# Patient Record
Sex: Male | Born: 1970 | Race: White | Hispanic: No | Marital: Single | Smoking: Current every day smoker
Health system: Southern US, Community
[De-identification: ages and names within clinical notes are randomized; demographics above are authoritative.]

## PROBLEM LIST (undated history)

## (undated) DIAGNOSIS — E079 Disorder of thyroid, unspecified: Secondary | ICD-10-CM

## (undated) DIAGNOSIS — I10 Essential (primary) hypertension: Secondary | ICD-10-CM

## (undated) HISTORY — PX: NO PAST SURGERIES: SHX2092

---

## 2001-02-20 ENCOUNTER — Emergency Department (HOSPITAL_COMMUNITY): Admission: EM | Admit: 2001-02-20 | Discharge: 2001-02-20 | Payer: Self-pay | Admitting: Emergency Medicine

## 2003-01-03 ENCOUNTER — Emergency Department (HOSPITAL_COMMUNITY): Admission: EM | Admit: 2003-01-03 | Discharge: 2003-01-04 | Payer: Self-pay | Admitting: Emergency Medicine

## 2003-01-03 ENCOUNTER — Encounter: Payer: Self-pay | Admitting: Emergency Medicine

## 2003-04-09 ENCOUNTER — Emergency Department (HOSPITAL_COMMUNITY): Admission: EM | Admit: 2003-04-09 | Discharge: 2003-04-09 | Payer: Self-pay | Admitting: Emergency Medicine

## 2003-04-09 ENCOUNTER — Encounter: Payer: Self-pay | Admitting: Emergency Medicine

## 2003-04-10 ENCOUNTER — Emergency Department (HOSPITAL_COMMUNITY): Admission: EM | Admit: 2003-04-10 | Discharge: 2003-04-10 | Payer: Self-pay | Admitting: Emergency Medicine

## 2003-04-12 ENCOUNTER — Emergency Department (HOSPITAL_COMMUNITY): Admission: EM | Admit: 2003-04-12 | Discharge: 2003-04-12 | Payer: Self-pay | Admitting: Emergency Medicine

## 2004-01-29 ENCOUNTER — Emergency Department (HOSPITAL_COMMUNITY): Admission: EM | Admit: 2004-01-29 | Discharge: 2004-01-29 | Payer: Self-pay | Admitting: *Deleted

## 2005-02-11 ENCOUNTER — Emergency Department: Payer: Self-pay | Admitting: Emergency Medicine

## 2006-03-25 ENCOUNTER — Emergency Department: Payer: Self-pay | Admitting: Emergency Medicine

## 2006-04-18 IMAGING — CR DG CHEST 2V
1 series · 2 of 2 positions shown · non-contrast
Comparison: none

REASON FOR EXAM: Chest pain. Rm 14
COMMENTS:

PROCEDURE:     DXR - DXR CHEST PA (OR AP) AND LATERAL  - February 11, 2005  [DATE]
RESULT:     PA and lateral views of the chest show the lungs to be clear and
well expanded without evidence of infiltrates, effusions or mass.

[Series 1: view not recorded · 0.17mm/px · 2 of 2 slices shown]
[im 1/2]
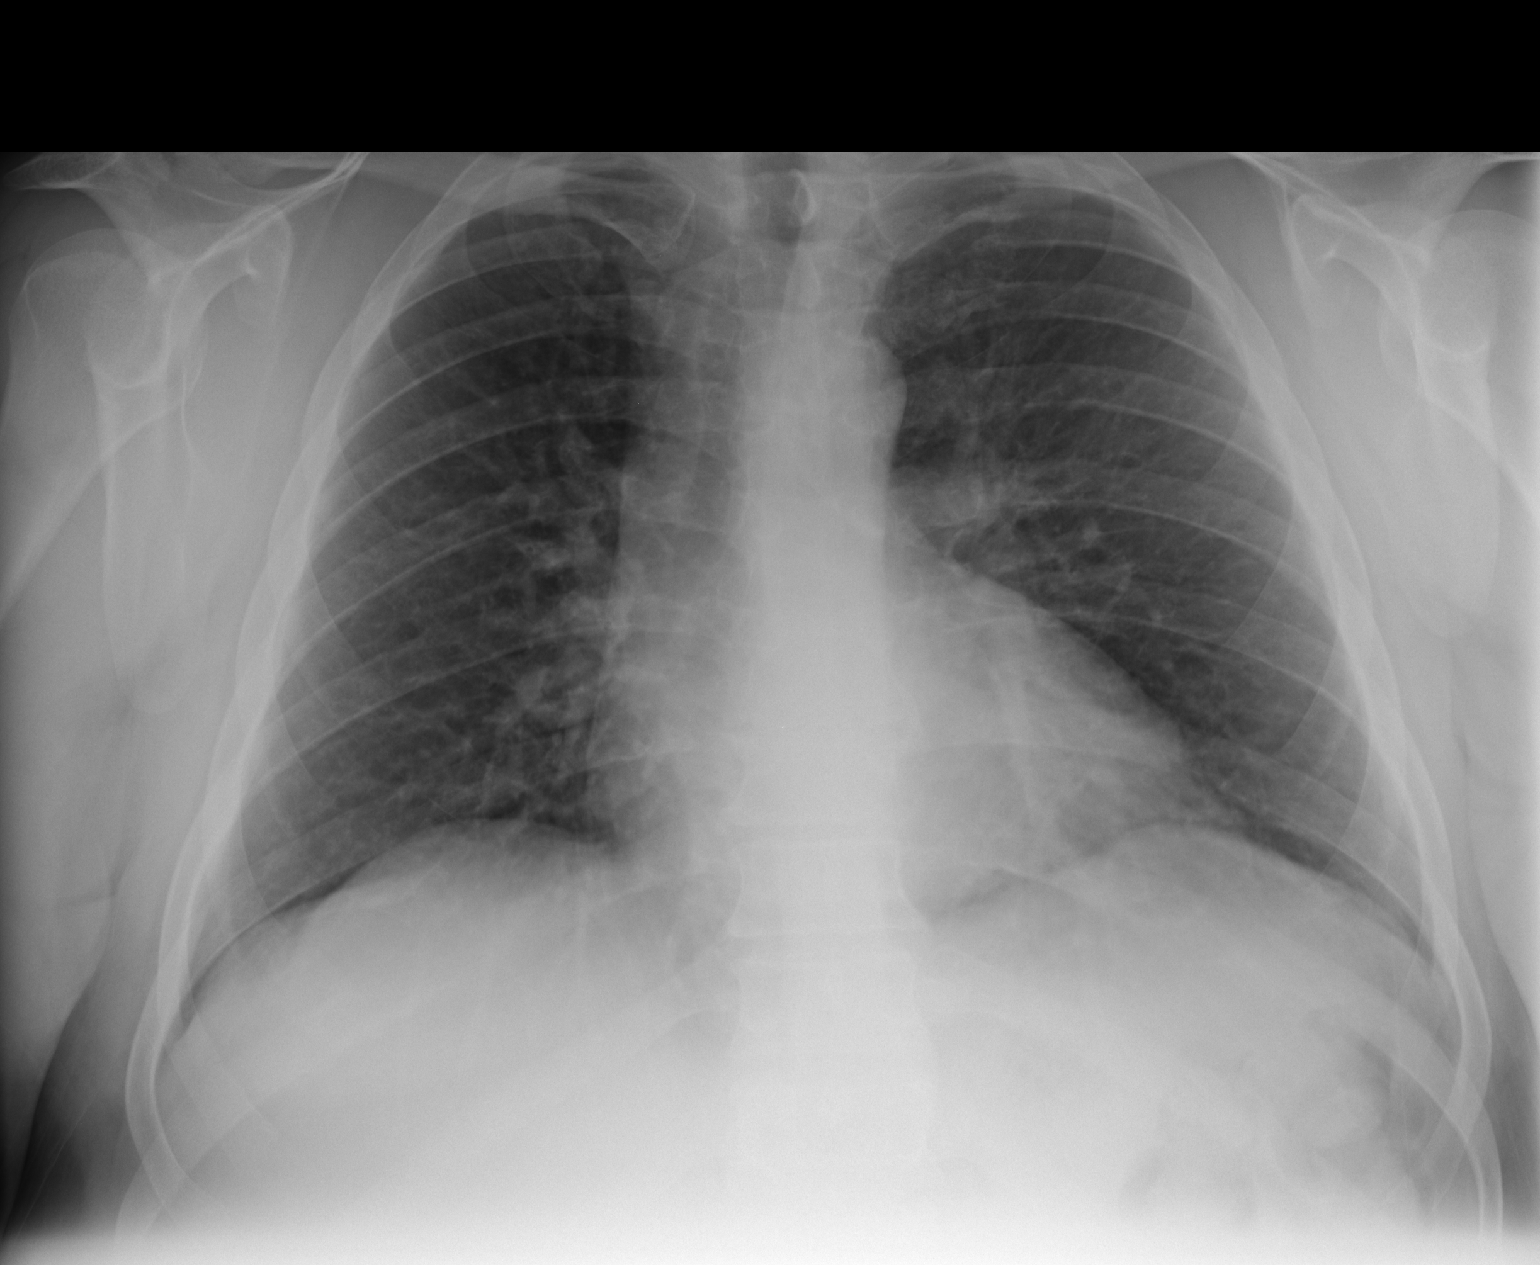
[im 2/2]
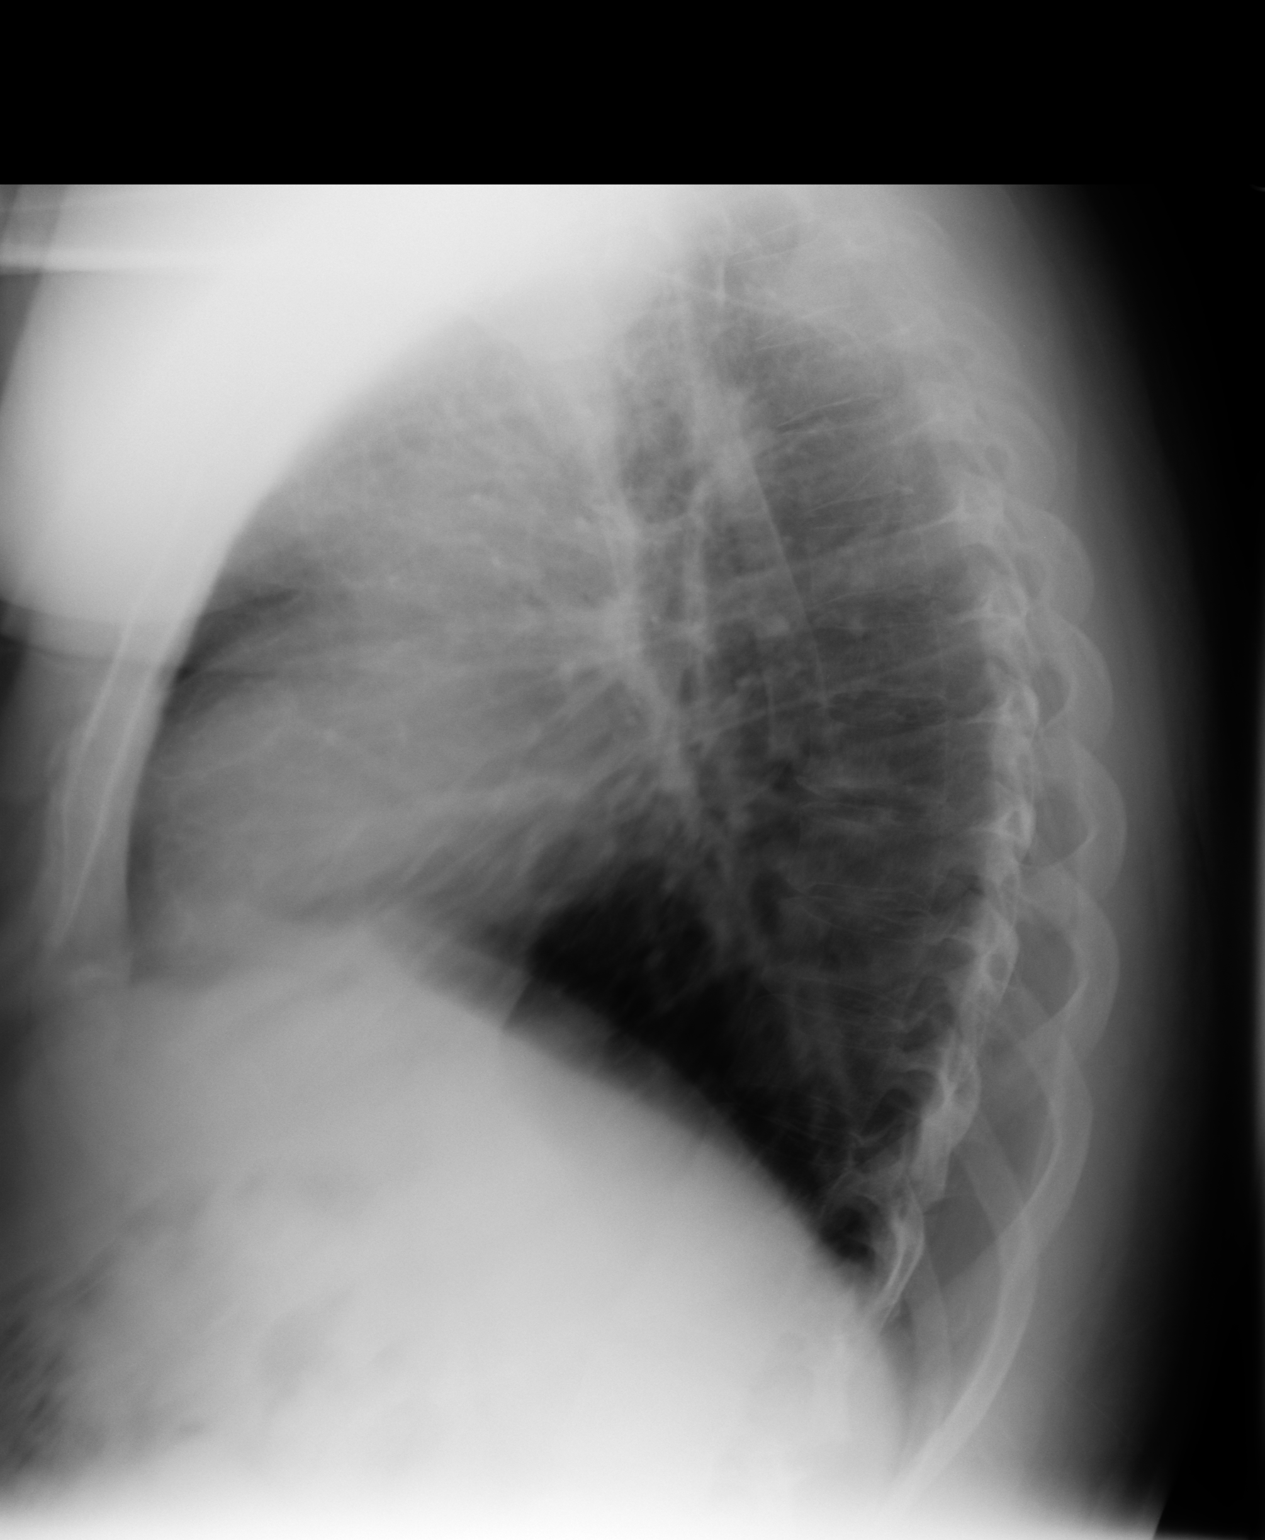

[2 of 2 positions shown; findings below may reference images not displayed]

IMPRESSION: No acute pulmonary disease.

## 2006-04-18 IMAGING — CR DG SHOULDER 3+V*R*
1 series · 3 of 3 positions shown · non-contrast
Comparison: none

REASON FOR EXAM: pain/accident/rm 14
COMMENTS:

[Series 1: view not recorded · 0.17mm/px · 3 of 3 slices shown]
[im 1/3]
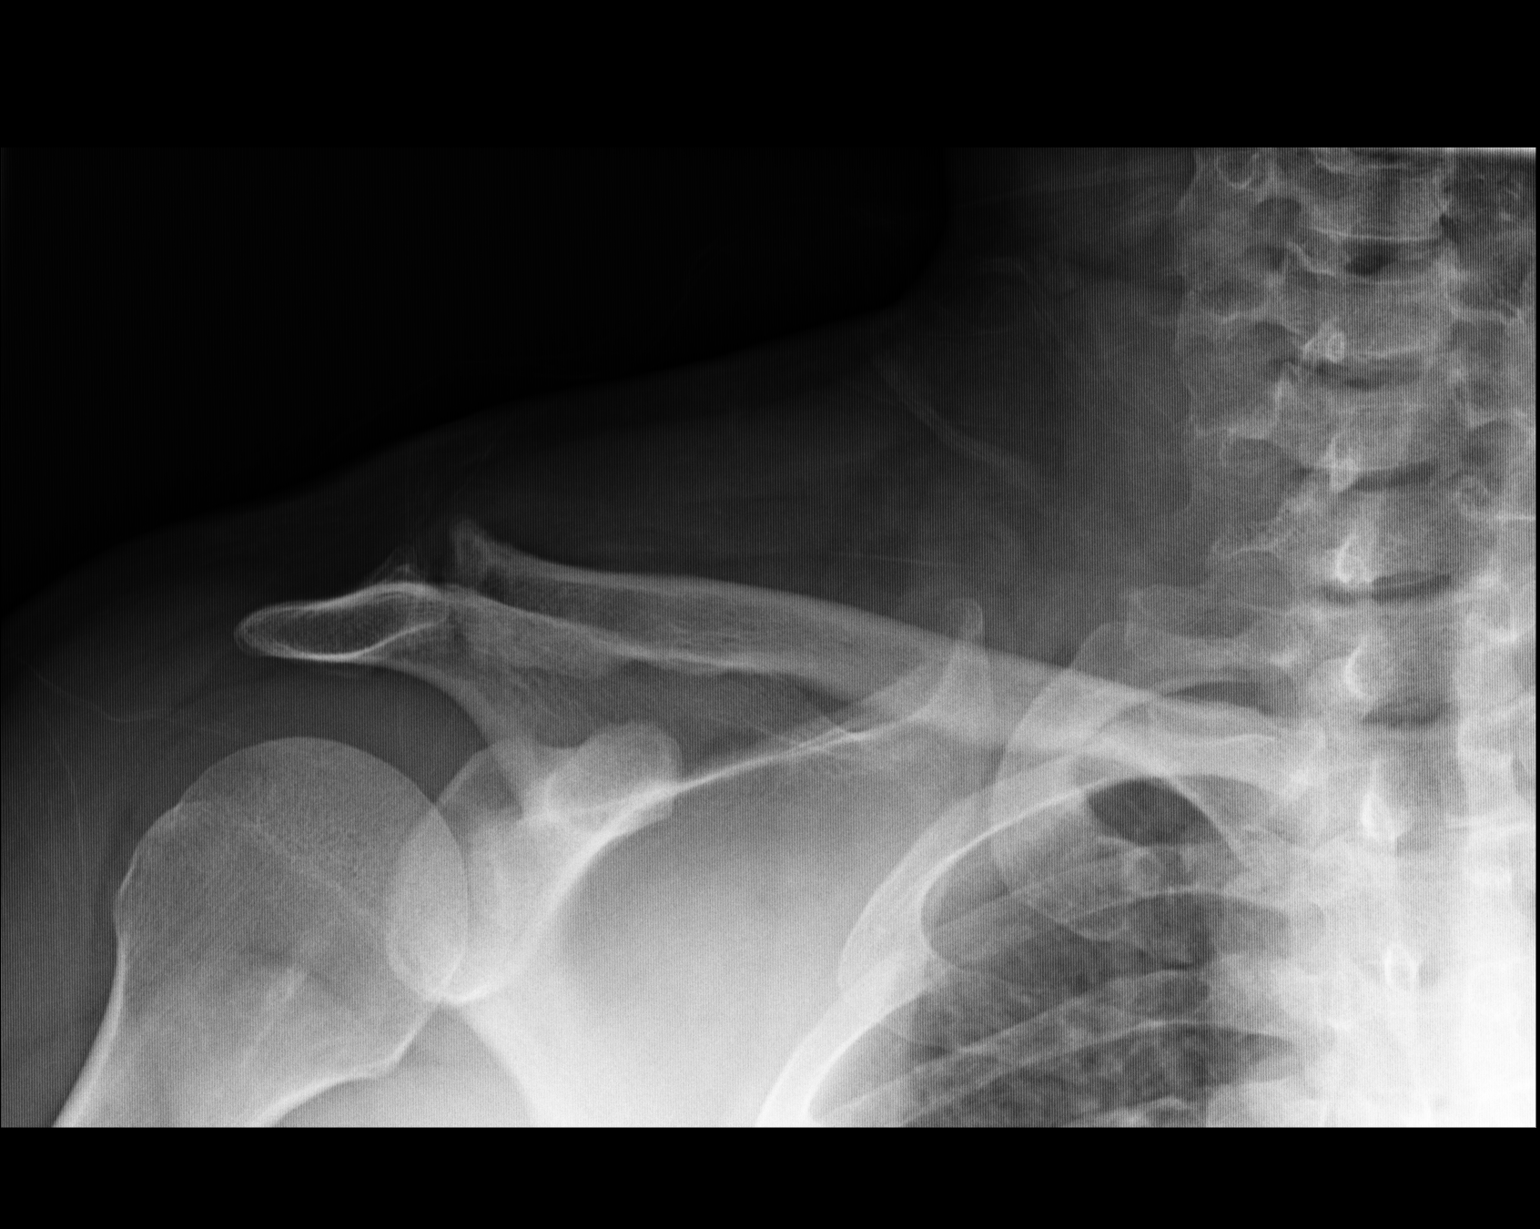
[im 2/3]
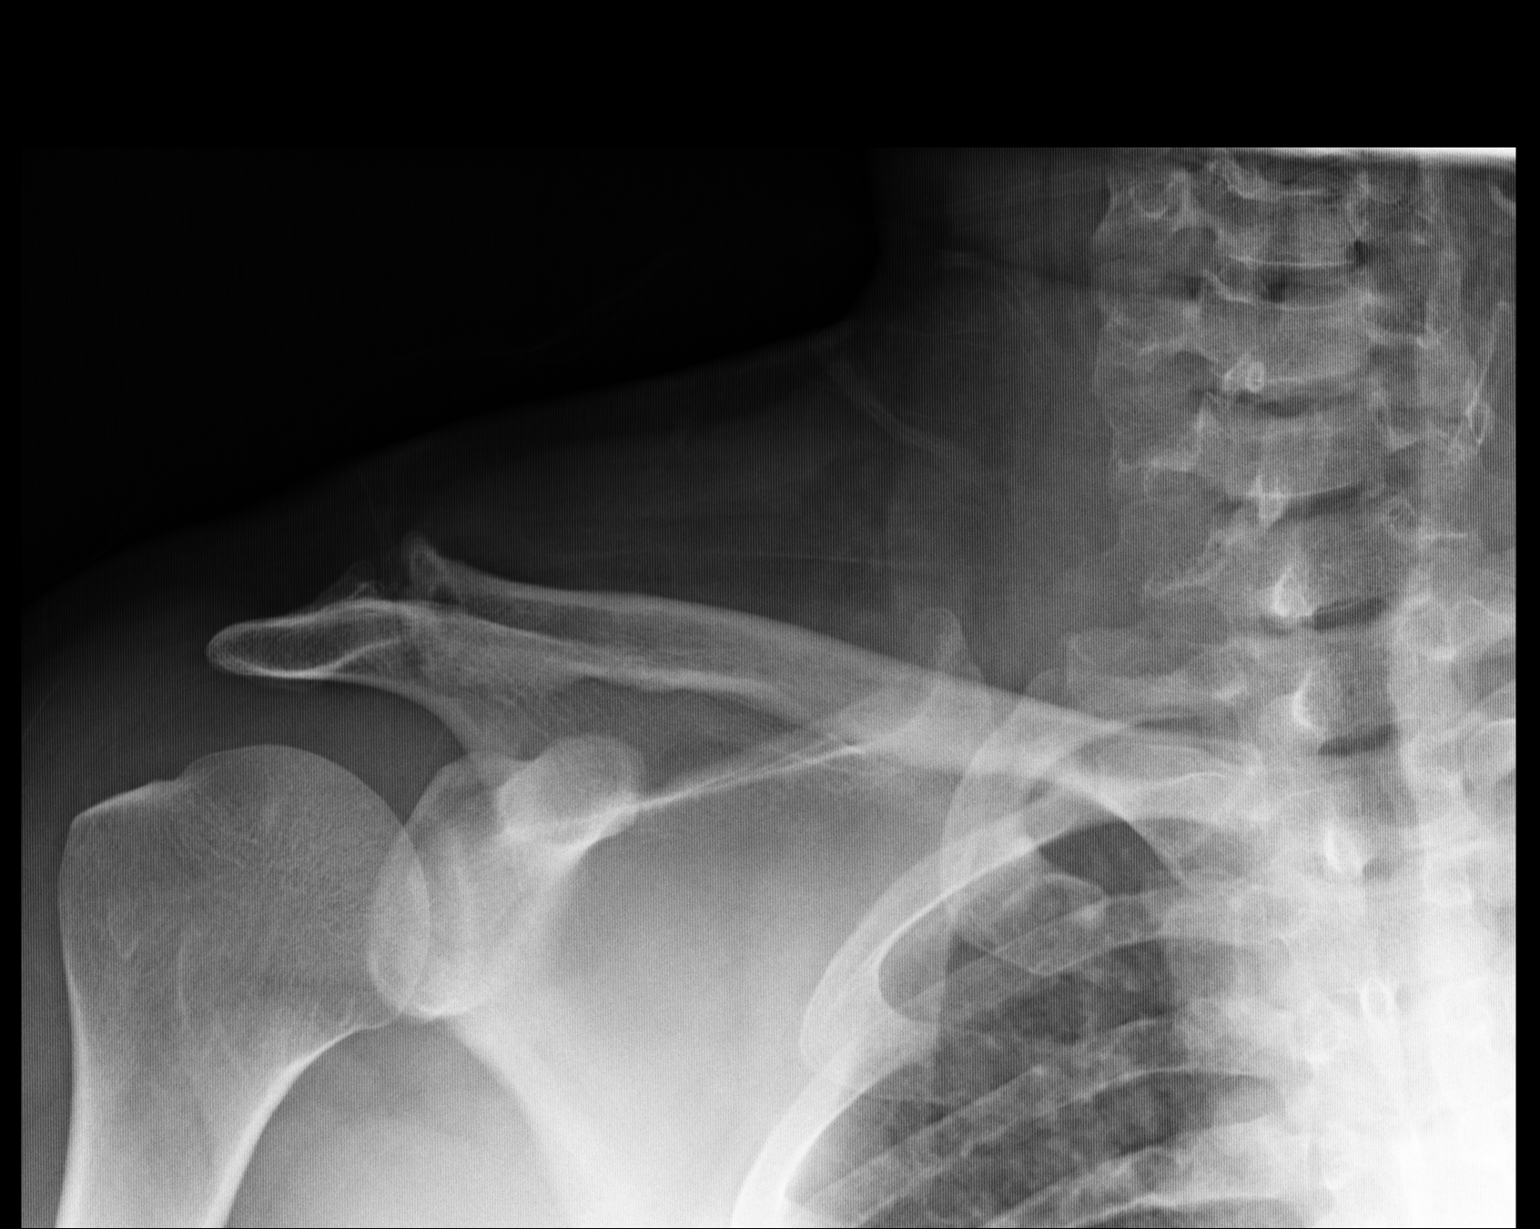
[im 3/3]
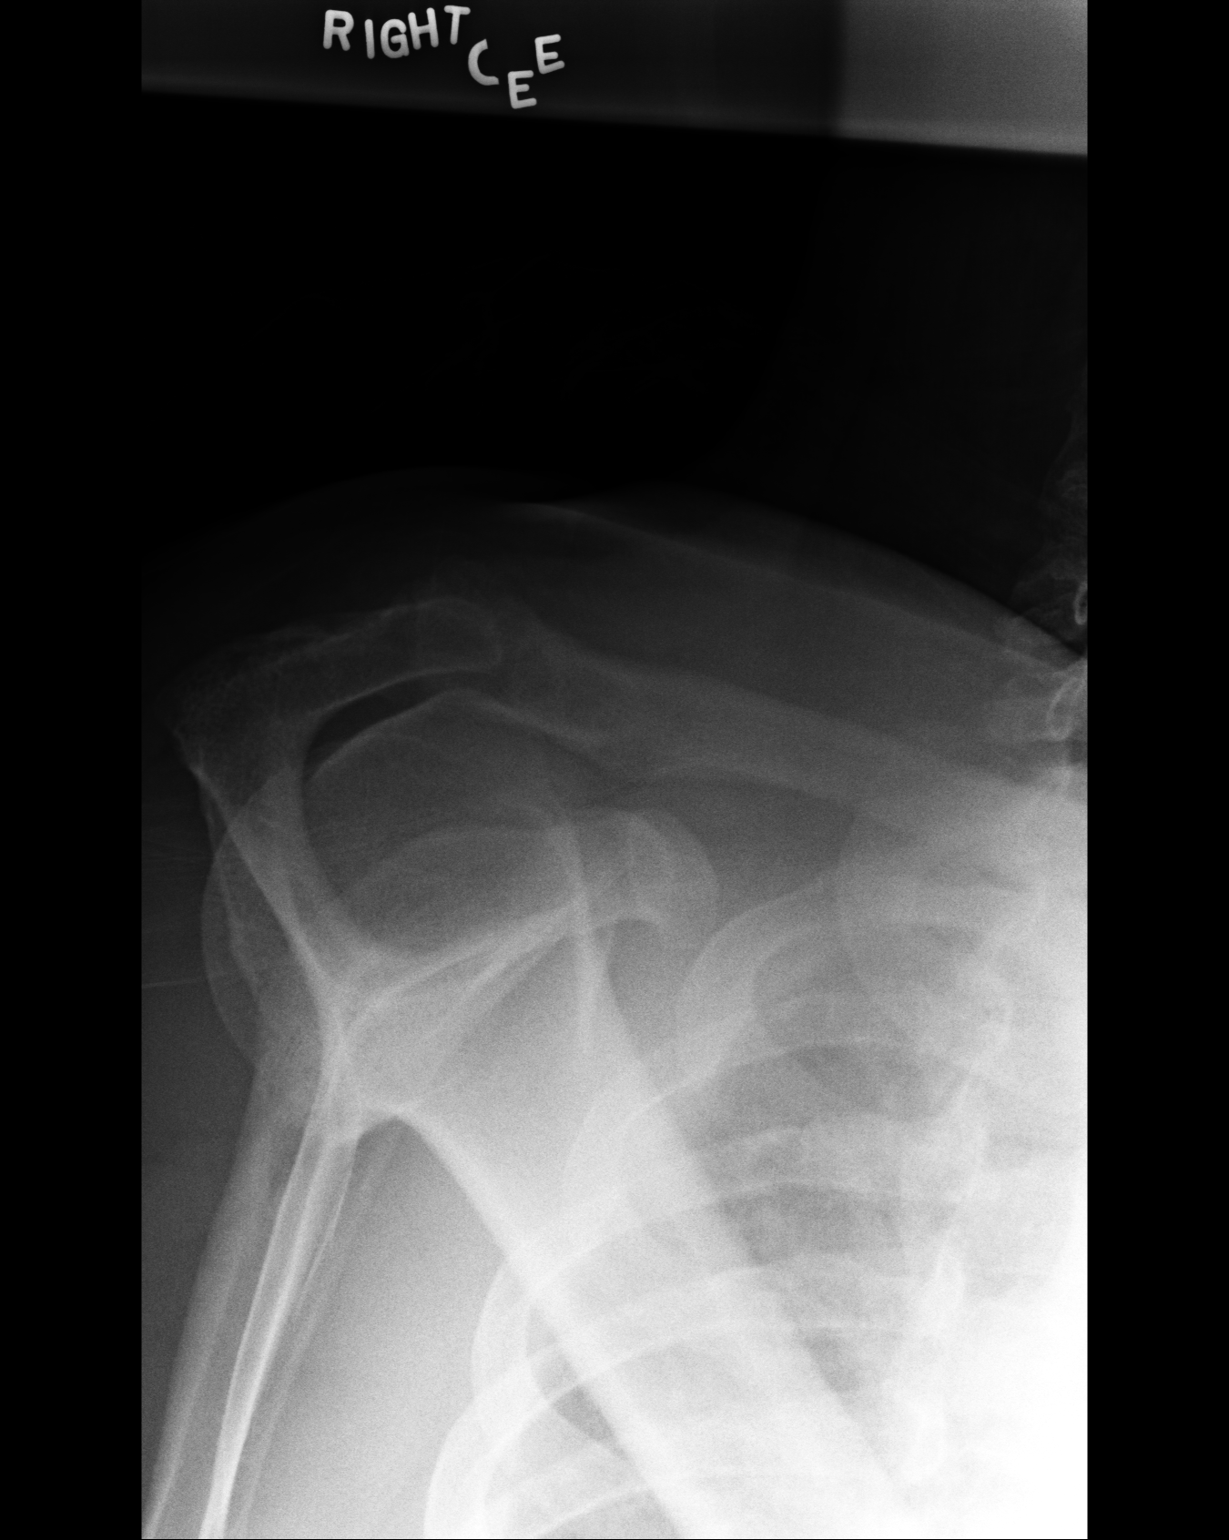

[3 of 3 positions shown; findings below may reference images not displayed]

PROCEDURE:     DXR - DXR SHOULDER RIGHT COMPLETE  - February 11, 2005  [DATE]

RESULT:     Multiple views of the RIGHT shoulder show arthritic narrowing of
the acromioclavicular joint with a prominent spur on the undersurface of the
distal clavicle. This could be impinging upon the supraspinatus tendon. The
glenohumeral joint is well preserved without evidence of fracture,
dislocation, or subluxation. There is no underlying rib fracture or
pneumothorax.
IMPRESSION: 1)Arthritic narrowing of the acromioclavicular joint without evidence of
acute traumatic abnormality.

## 2006-04-18 IMAGING — CR DG LUMBAR SPINE 2-3V
1 series · 3 of 3 positions shown · non-contrast
Comparison: none

REASON FOR EXAM: pain/accident/rm 14
COMMENTS:

[Series 1: view not recorded · 0.17mm/px · 3 of 3 slices shown]
[im 1/3]
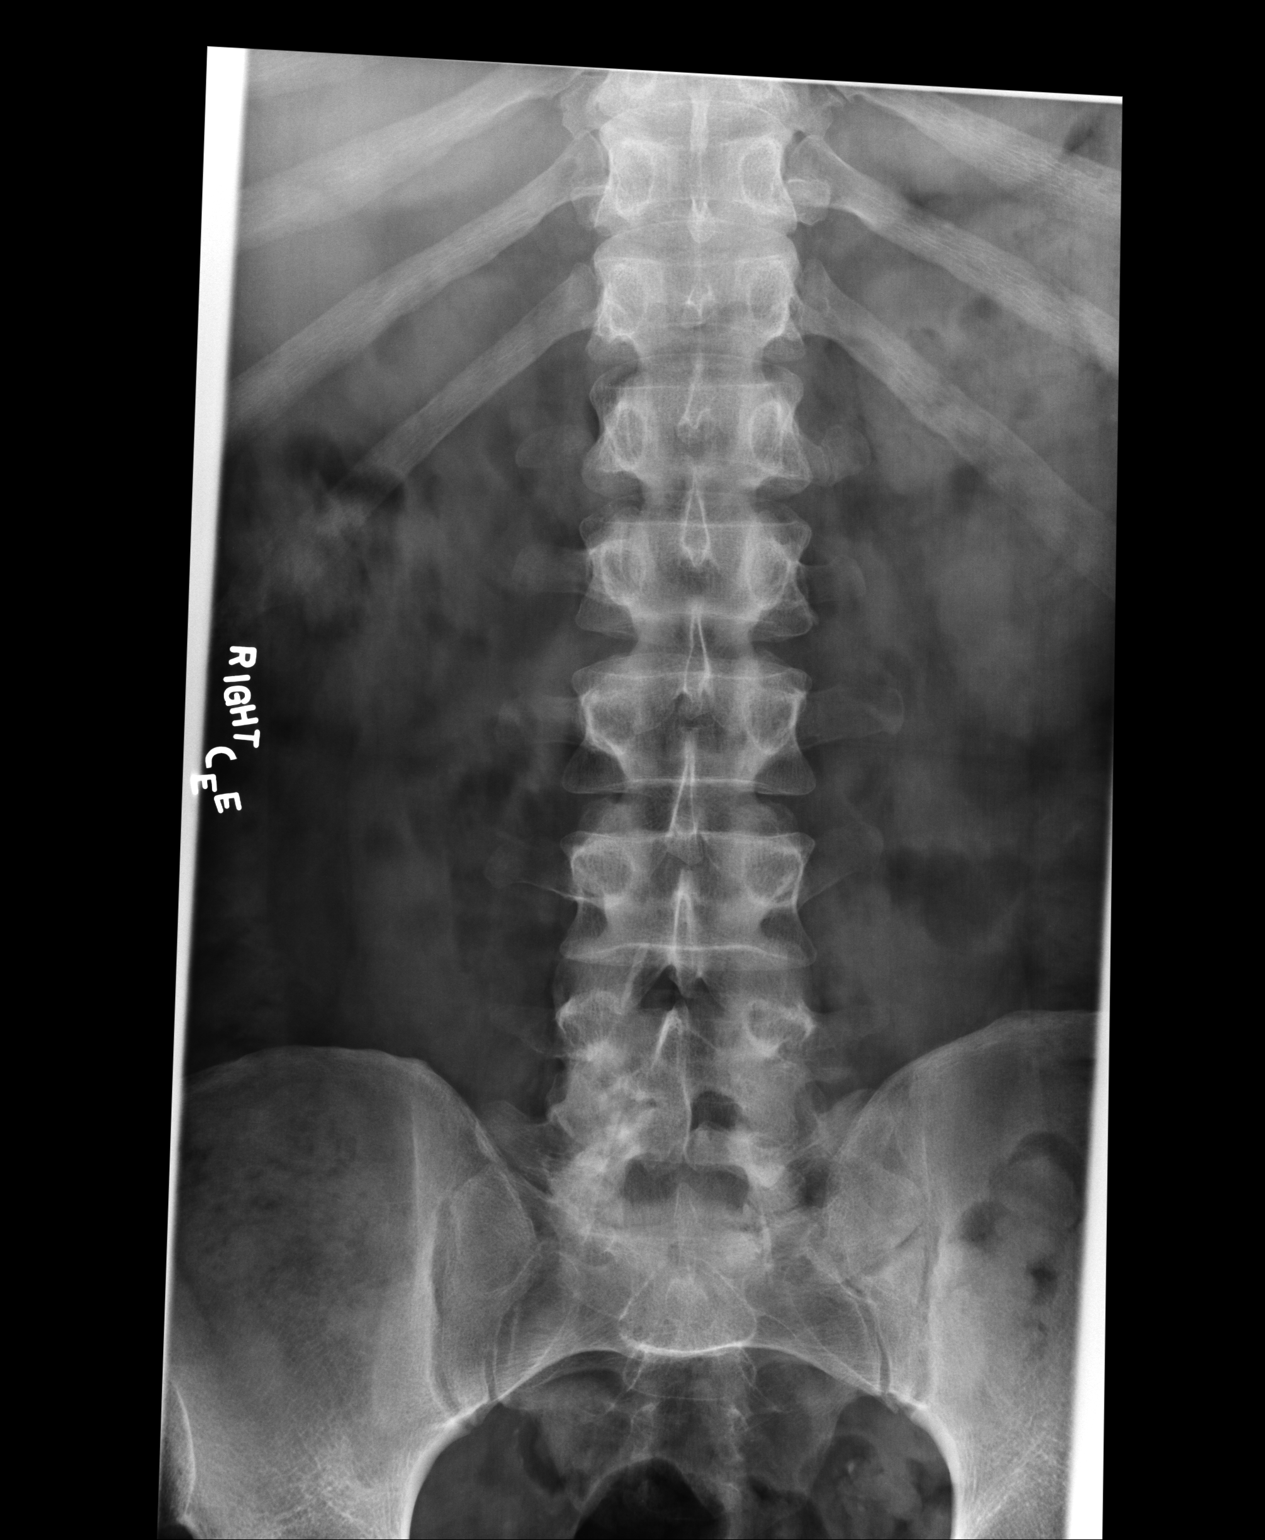
[im 2/3]
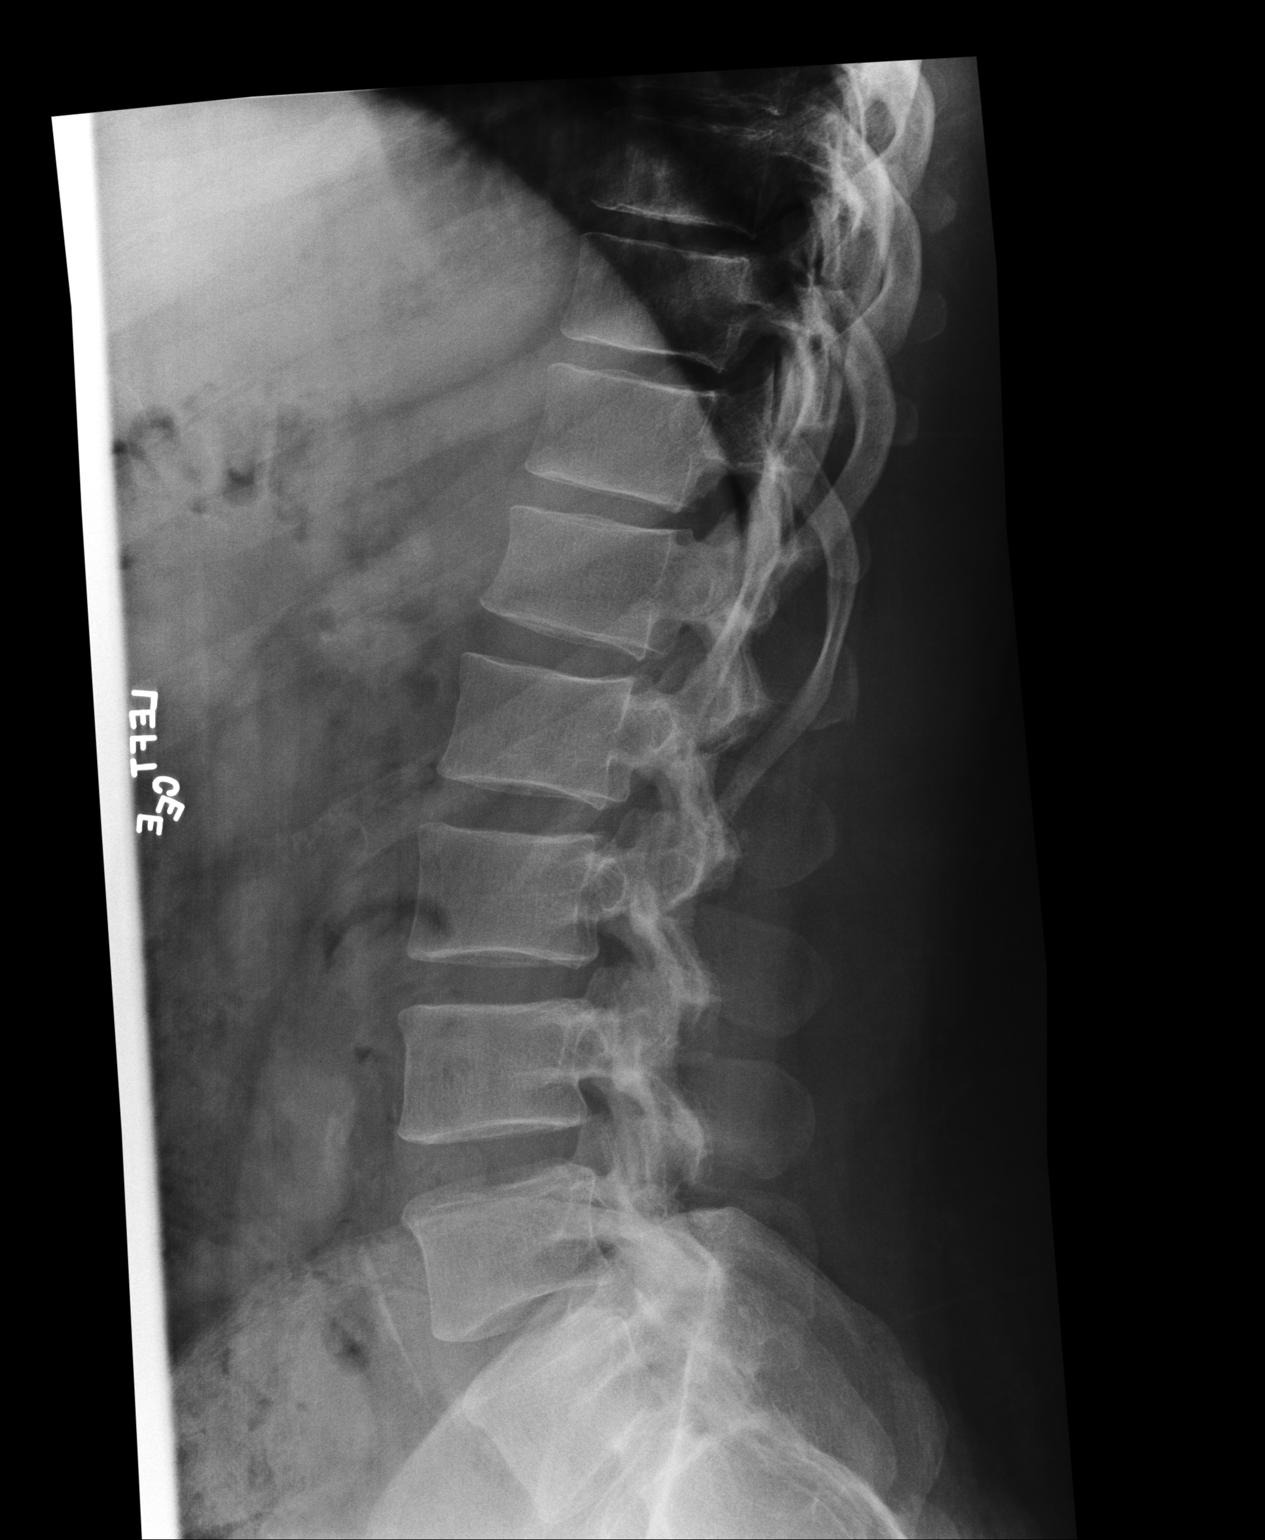
[im 3/3]
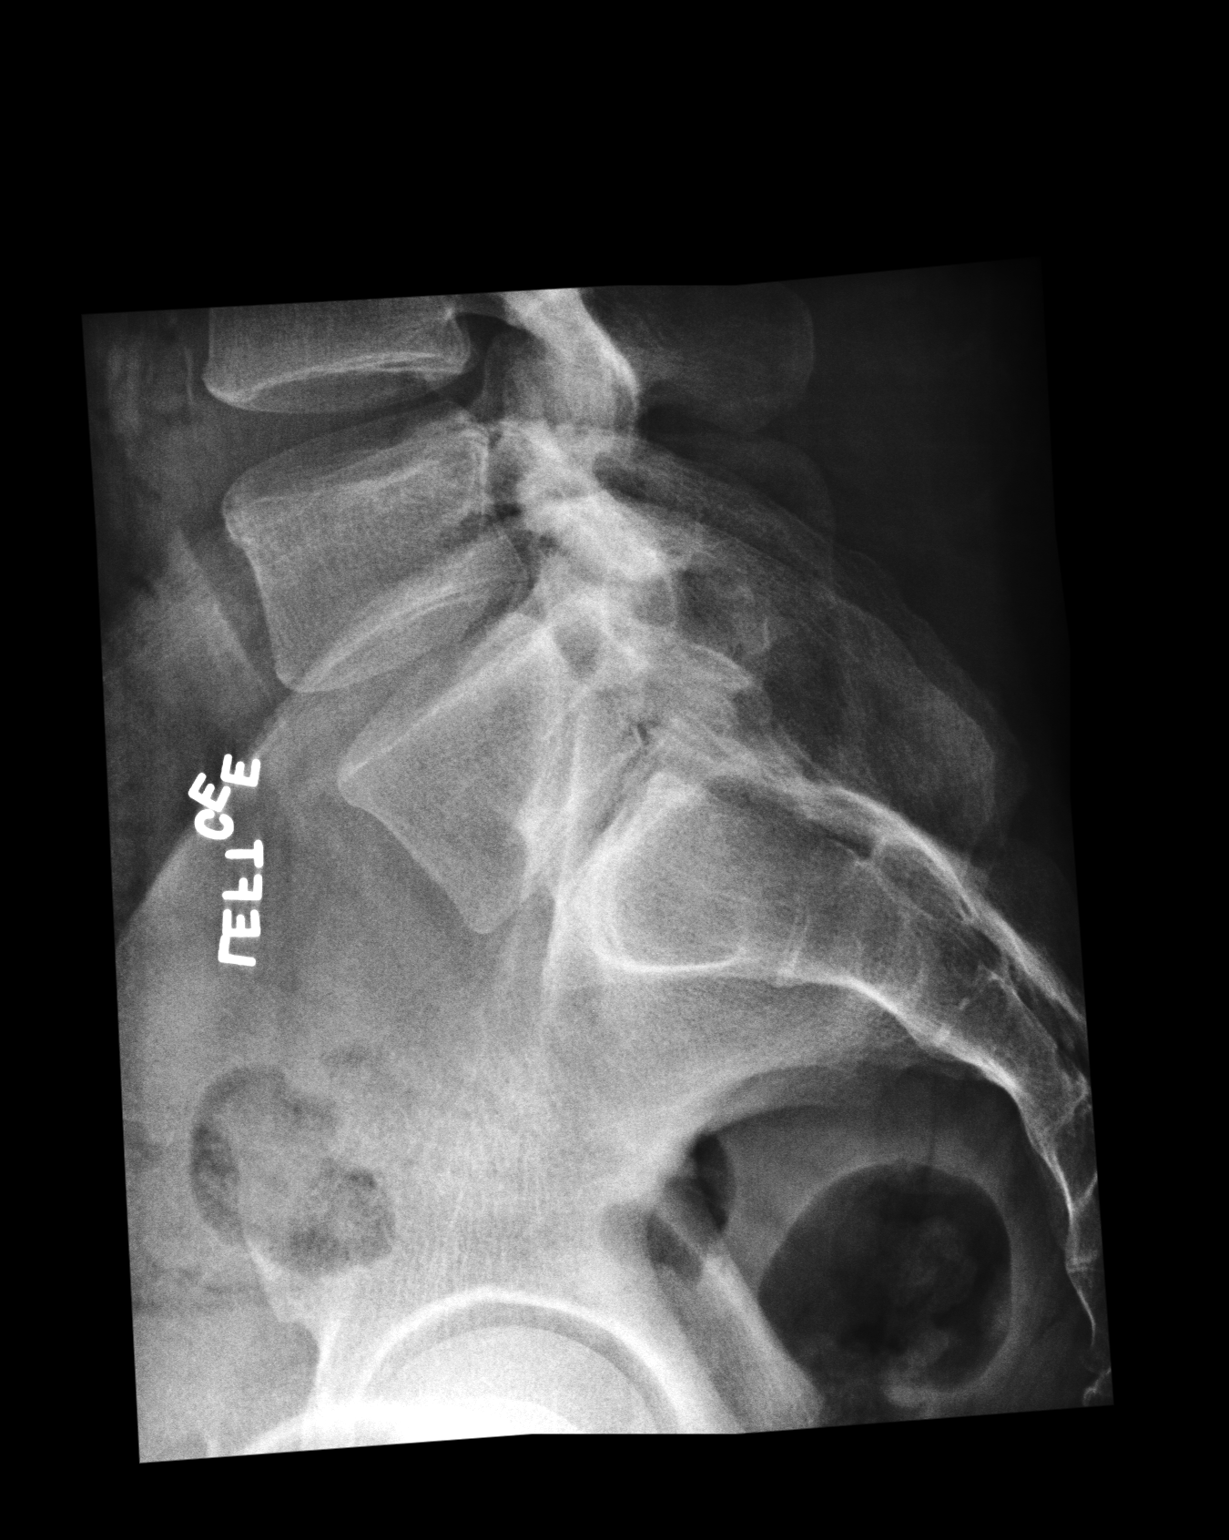

[3 of 3 positions shown; findings below may reference images not displayed]

PROCEDURE:     DXR - DXR LUMBAR SPINE AP AND LATERAL  - February 11, 2005  [DATE]

RESULT:        The lumbar spine aligns anatomically without evidence of
fracture, pars defect or spondylolisthesis.  The intervertebral disc spaces
and vertebral body heights are well maintained. There is no suspicious
paraspinal mass.
IMPRESSION: Normal lumbar spine.

## 2008-02-02 ENCOUNTER — Emergency Department: Payer: Self-pay | Admitting: Emergency Medicine

## 2008-04-14 ENCOUNTER — Encounter
Admission: RE | Admit: 2008-04-14 | Discharge: 2008-07-13 | Payer: Self-pay | Admitting: Physical Medicine & Rehabilitation

## 2008-04-17 ENCOUNTER — Ambulatory Visit: Payer: Self-pay | Admitting: Physical Medicine & Rehabilitation

## 2008-05-29 ENCOUNTER — Ambulatory Visit: Payer: Self-pay | Admitting: Physical Medicine & Rehabilitation

## 2008-07-10 ENCOUNTER — Encounter
Admission: RE | Admit: 2008-07-10 | Discharge: 2008-10-08 | Payer: Self-pay | Admitting: Physical Medicine & Rehabilitation

## 2008-07-12 ENCOUNTER — Ambulatory Visit: Payer: Self-pay | Admitting: Physical Medicine & Rehabilitation

## 2008-09-06 ENCOUNTER — Ambulatory Visit: Payer: Self-pay | Admitting: Physical Medicine & Rehabilitation

## 2008-11-28 ENCOUNTER — Emergency Department: Payer: Self-pay | Admitting: Internal Medicine

## 2008-12-29 ENCOUNTER — Encounter: Admission: RE | Admit: 2008-12-29 | Discharge: 2009-01-16 | Payer: Self-pay | Admitting: Anesthesiology

## 2009-01-02 ENCOUNTER — Ambulatory Visit: Payer: Self-pay | Admitting: Anesthesiology

## 2009-01-12 ENCOUNTER — Ambulatory Visit: Payer: Self-pay | Admitting: Anesthesiology

## 2009-01-16 ENCOUNTER — Ambulatory Visit: Payer: Self-pay | Admitting: Anesthesiology
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
## 2011-03-25 NOTE — Assessment & Plan Note (Signed)
Wayne Heath returns to clinic today for followup evaluation.  He has  been using his oxycodone 15 mg generally 4 times per day.  He reports  that he gets relief for approximately 1-2 hours with each dose.  He  tries to do chores around his home in addition to caring for his son.  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  He is  requesting a pain medicine that he could use and that is slow release,  that would be available to him throughout the day instead of just using  pills.  He has not used any OxyContin or fentanyl patch in the past.  He  reports that his primary care physician is adjusting his blood pressure  medicines.  He also requests a refill on his alprazolam in the office  today.   The patient also reports stiffness of his hands that occurs through the  night.  He reportedly had nerve conduction studies done in the past  which showed carpal tunnel bilaterally.  He reports that he resisted any  surgery and then had repeat studies that showed improvement.  He has not  had any surgery for carpal tunnel syndrome and has no splints that he  uses on a regular basis.   REVIEW OF SYSTEMS:  Noncontributory.   MEDICATIONS:  1. Oxycodone 15 mg one tablet q.i.d. p.r.n.  2. Alprazolam 2 mg one tablet t.i.d. p.r.n.  3. Nexium 40 mg daily.  4. Lipitor 40 mg daily.  5. Pain cream on the hands p.r.n.  6. Blood pressure medicine daily.   PHYSICAL EXAMINATION:  GENERAL:  A well-appearing, large, well-nourished  adult male.  VITAL SIGNS:  Height 5 feet and 11 inches, weight 270 pounds, blood  pressure is 168/96 with a pulse of 70, respiratory rate 18, and O2  saturation 98% on room air.  EXTREMITIES:  He has 5/5 strength throughout the bilateral upper and  lower extremities.  Sensation is intact to light touch throughout.   IMPRESSION:  1. Diffuse cervical and lumbar pain with minimal degenerative findings      on plain films/MRI scan.  2. Chronic arthritic  pain in the bilateral hands and arms.  3. Status post work injury with crowbar contusion to the chest.   In the office today, we did add a fentanyl patch at 25 mcg/hr to be  changed every 72 hours to his pain regimen.  He will continue to use his  oxycodone immediate release 15 mg up to 4 times per day but hopefully he  will be able to decrease that usage with a fentanyl patch being added.  We have also refilled his alprazolam in the office today.  We have  referred him for outpatient occupational therapy for resting splints to  be used at night and removed in the morning.  We will plan on seeing the  patient in followup in approximately 2 months' time with refills prior  to that appointment if necessary.           ______________________________  Ellwood Dense, M.D.     DC/MedQ  D:  07/12/2008 10:32:34  T:  07/13/2008 00:32:55  Job #:  [id number]
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
## 2011-03-25 NOTE — Assessment & Plan Note (Signed)
Mr. Lefferts returns to the clinic today for followup evaluation.  He has  been using his pain medicines and getting fair relief, although he still  reports that he would prefer better relief.  He has been using oxycodone  15 mg 4 times per day for a total of 60 mg daily.  He had been up to 150  mg in the past prior to starting treatments with this office.  He is  using his Fentanyl patch 25 mcg/hour change q.72 h.  He is also  maintained on Nexium along with Lipitor and lisinopril.  He would like  an adjustment of his medicines if at all possible.   REVIEW OF SYSTEMS:  Noncontributory.   MEDICATIONS:  1. Oxycodone 15 mg 1 tablet q.i.d. p.r.n.  2. Alprazolam 2 mg t.i.d. p.r.n.  3. Nexium 40 mg daily.  4. Lipitor 40 mg daily.  5. Lisinopril daily.   PHYSICAL EXAMINATION:  GENERAL:  Well-appearing, well-nourished adult  male in moderate acute discomfort.  VITAL SIGNS:  Blood pressure 135/74 with pulse 96, respiratory rate 16,  and O2 saturation 96% on room air.  MUSCULOSKELETAL:  The patient has 5/5 strength throughout the bilateral  upper and lower extremities.  Bulk and tone were normal.  He ambulates  without any assistive device.  Lumbar and cervical range of motion were  slightly decreased throughout.   IMPRESSION:  1. Diffuse cervical and lumbar pain with minimal degenerative findings      on plain film/MRI scan.  2. Chronic arthritic pain of the bilateral hands and chest.  3. Prior history of crowbar contusion to the chest.   In the office today, we did adjust the patient's oxycodone 15 mg up to 6  times per day p.r.n. up from 4 per day.  We have made that change today  and noted that on his next prescription is due for refill on September 17, 2008.  We also refilled his Fentanyl as of October 04, 2008 and his  alprazolam as of October 02, 2008.  We will plan on seeing the patient  in followup in approximately 3 months' time with refills prior to that  appointment as  necessary.  The patient can increase his oxycodone up to  6 times per day as of today and that was taken into consideration  related to the next refill due date.           ______________________________  Ellwood Dense, M.D.     DC/MedQ  D:  09/06/2008 09:57:31  T:  09/06/2008 22:10:34  Job #:  [id number]

## 2011-03-25 NOTE — Assessment & Plan Note (Signed)
[facility] to [facility] today.  I evaluated him via  health and history form 14-point review of systems.   He brings his medication inappropriate and seemingly appropriate.  He is  having difficulty with his some of his situational anxiety, but he is  taking a proactive role in this, and he is getting into Psychiatry.  We  will continue him on Xanax at current level as I think he is stable  here, 1 mg b.i.d. with no advancing anxiety, some worries about this,  but I think just a lot of questions needed to be answered here, he  seemed pretty stable, no barrier to communication.  We were also going  to keep him on Embeda.  He requests to go back to oxycodone, which we  are not going to do.  The drug screen was not done to our request.  Did  not show fentanyl, but the marrow toxicity did not run it.  We will  clear that up with marrow toxicity with ongoing adherence monitoring  necessity.   Slowly start coming down on the analgesic load, as I relate him it is  important to be  enabling.  He has children at home.  He is to be  successful Psychologist, sport and exercise.  I do not see anything that would hold his  Bactrim looking at that as a true goal.  If our Psychiatry colleagues  can help [place] with some situational anxiety, I think very likely we will  have some improvement in low back issues, as we can offer him an  epidural interventional approaches, minimally invasive, and I have  reviewed the MRI with him.  I do not see anything in particular that  would hold his back from moving forward in this arena.  More importantly  a 40 year old , I hope he would not take the course of disability.  I do  not see that to be a clear pathway productivity for [place].   OBJECTIVE:  1. Diffuse paracervical and paralumbar myofascial discomfort, really      nothing new from neurological perspective.  2. Spondylitic myofascial component.   IMPRESSION:  Degenerative spondylosis of lumbar spine,  degenerative  spondylosis of cervical spine, and situational anxiety.   PLAN:  As outlined above.  As I relate to him, we are working to get him  off these drugs, and deferred Xanax to the psychiatrist once sepsis has  been established, and hopefully back to work-related activities.  I do  not think he is a disability candidate, we will attempt to be enabling  possibly physical therapy and get in with our physiatry colleagues.           ______________________________  Wayne Kras, MD     HH/MedQ  D:  01/16/2009 10:01:40  T:  01/16/2009 22:49:49  Job #:  [id number]

## 2011-03-25 NOTE — Assessment & Plan Note (Signed)
Wayne Heath returns to clinic today for followup evaluation.  I first and  last saw the patient in this office on April 17, 2008, for evaluation and  treatment of chronic diffuse neck and back pain.  At that time, we  decided to use oxycodone to be used 15 mg 1 tablet q.i.d. p.r.n.  He has  been using the medication as prescribed until a few days ago.  He was at  the work site and had a large crowbar fall from approximately 5-10 feet,  landing on the mid chest.  He did not seek medical attention but had  sternal pain since that time.  He did use a few extra tablets of this  oxycodone.  He understands that we can refill at this time, but he needs  to use it only as prescribed without overuse each month.  I understand  that the accidents occur, but I am not excepting accidents to occur on a  monthly basis.  We also reviewed his urine drug screen, which was  abnormal when we first obtained that during his first visit.  He  apparently forgot to tell us that he had used hydrocodone given to him  from an emergency room physician and also methadone that was given to  him from a friend.  He understands that those usage need to be  eliminated, and we will check him for a urine drug screen during the  next clinic visit and possibly further clinic visits in the future.  He  should be only positive for alprazolam and oxycodone at the present  time.  The patient understands that he needs to be compliant with the  listed medications and not to take other medicines as he wishes.   REVIEW OF SYSTEMS:  Noncontributory.   MEDICATIONS:  1. Oxycodone 15 mg 1 tablet q.i.d. p.r.n.  2. Alprazolam 2 mg 1 tablet t.i.d. p.r.n.  3. Nexium 40 mg.  4. Lipitor 40 mg daily.  5. Pain cream on the hands p.r.n.   PHYSICAL EXAMINATION:  Well-appearing large adult male.  Height 5 feet  11 inches, weight 270 pounds, blood pressure was 192/130 in the left  upper extremity, 158/112 in the right upper extremity.  Pulse was  74  with respiratory rate of 20 and O2 saturation of 99% on room air.  He  has 4+/5 strength throughout the bilateral upper extremities with the  complaints of pain in his mid chest, where he has a slight skin  breakdown.  He ambulates without any assistive device.  Strength was  4+/5 throughout the bilateral lower extremities.   He continues to smoke 1 pack of cigarettes per day.   IMPRESSION:  1. Diffuse cervical and lumbar pain with minimal degenerative findings      on plain films/MRI scan.  2. Chronic arthritic pain on bilateral hands and arms.  3. Status post work injury with crowbar contusion to the chest.   In the office today, we did refill his oxycodone slightly early but told  him that this will not happen on a monthly basis.  He is allowed to use  15 mg one tablet q.i.d. p.r.n., and we gave him this script dated for  June 01, 2008.  We refilled his alprazolam at a normal scheduled date as  of June 13, 2008.  We will plan on seeing the patient in followup in  approximately 2 months' time, and we will check the urine drug screen  within the next visit or 2.  ______________________________  Ellwood Dense, M.D.     DC/MedQ  D:  05/29/2008 15:59:53  T:  05/30/2008 06:47:20  Job #:  [id number]

## 2011-04-28 ENCOUNTER — Encounter: Payer: Self-pay | Admitting: Anesthesiology

## 2011-05-11 ENCOUNTER — Encounter: Payer: Self-pay | Admitting: Anesthesiology

## 2011-08-04 ENCOUNTER — Emergency Department: Payer: Self-pay | Admitting: *Deleted

## 2014-07-07 ENCOUNTER — Emergency Department: Payer: Self-pay | Admitting: Emergency Medicine

## 2016-02-18 ENCOUNTER — Encounter: Payer: Self-pay | Admitting: Physical Medicine & Rehabilitation

## 2016-03-20 ENCOUNTER — Encounter: Payer: Self-pay | Admitting: Emergency Medicine

## 2016-03-20 ENCOUNTER — Ambulatory Visit (INDEPENDENT_AMBULATORY_CARE_PROVIDER_SITE_OTHER): Payer: BLUE CROSS/BLUE SHIELD

## 2016-03-20 ENCOUNTER — Ambulatory Visit
Admission: EM | Admit: 2016-03-20 | Discharge: 2016-03-20 | Disposition: A | Discharge status: Home / Self Care | Payer: BLUE CROSS/BLUE SHIELD | Attending: Family Medicine | Admitting: Family Medicine

## 2016-03-20 DIAGNOSIS — S5011XA Contusion of right forearm, initial encounter: Secondary | ICD-10-CM

## 2016-03-20 HISTORY — DX: Disorder of thyroid, unspecified: E07.9

## 2016-03-20 HISTORY — DX: Essential (primary) hypertension: I10

## 2016-03-20 MED ORDER — MELOXICAM 15 MG PO TABS: 15.0000 mg | ORAL_TABLET | Freq: Every day | ORAL | Status: AC

## 2016-03-20 NOTE — ED Notes (Signed)
Right arm injury. ( do not know how he hurt it) Painful, swollen for 2 days.

## 2016-03-20 NOTE — Discharge Instructions (Signed)
Cryotherapy °Cryotherapy is when you put ice on your injury. Ice helps lessen pain and puffiness (swelling) after an injury. Ice works the best when you start using it in the first 24 to 48 hours after an injury. °HOME CARE °· Put a dry or damp towel between the ice pack and your skin. °· You may press gently on the ice pack. °· Leave the ice on for no more than 10 to 20 minutes at a time. °· Check your skin after 5 minutes to make sure your skin is okay. °· Rest at least 20 minutes between ice pack uses. °· Stop using ice when your skin loses feeling (numbness). °· Do not use ice on someone who cannot tell you when it hurts. This includes small children and people with memory problems (dementia). °GET HELP RIGHT AWAY IF: °· You have white spots on your skin. °· Your skin turns blue or pale. °· Your skin feels waxy or hard. °· Your puffiness gets worse. °MAKE SURE YOU:  °· Understand these instructions. °· Will watch your condition. °· Will get help right away if you are not doing well or get worse. °  °This information is not intended to replace advice given to you by your health care provider. Make sure you discuss any questions you have with your health care provider. °  °Document Released: 04/14/2008 Document Revised: 01/19/2012 Document Reviewed: 06/19/2011 °Elsevier Interactive Patient Education ©2016 [employer] ° °Contusion °A contusion is a deep bruise. Contusions happen when an injury causes bleeding under the skin. Symptoms of bruising include pain, swelling, and discolored skin. The skin may turn blue, purple, or yellow. °HOME CARE  °· Rest the injured area. °· If told, put ice on the injured area. °¨ Put ice in a plastic bag. °¨ Place a towel between your skin and the bag. °¨ Leave the ice on for 20 minutes, 2-3 times per day. °· If told, put light pressure (compression) on the injured area using an elastic bandage. Make sure the bandage is not too tight. Remove it and put it back on as told by your  doctor. °· If possible, raise (elevate) the injured area above the level of your heart while you are sitting or lying down. °· Take over-the-counter and prescription medicines only as told by your doctor. °GET HELP IF: °· Your symptoms do not get better after several days of treatment. °· Your symptoms get worse. °· You have trouble moving the injured area. °GET HELP RIGHT AWAY IF:  °· You have very bad pain. °· You have a loss of feeling (numbness) in a hand or foot. °· Your hand or foot turns pale or cold. °  °This information is not intended to replace advice given to you by your health care provider. Make sure you discuss any questions you have with your health care provider. °  °Document Released: 04/14/2008 Document Revised: 07/18/2015 Document Reviewed: 03/14/2015 °Elsevier Interactive Patient Education ©2016 [employer] ° °
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
## 2017-05-25 IMAGING — CR DG ELBOW COMPLETE 3+V*R*
4 series · 5 of 5 positions shown · non-contrast
Comparison: None.

CLINICAL DATA: 44-year-old male with right arm contusion.

EXAM:
RIGHT ELBOW - COMPLETE 3+ VIEW

[elbow ap]
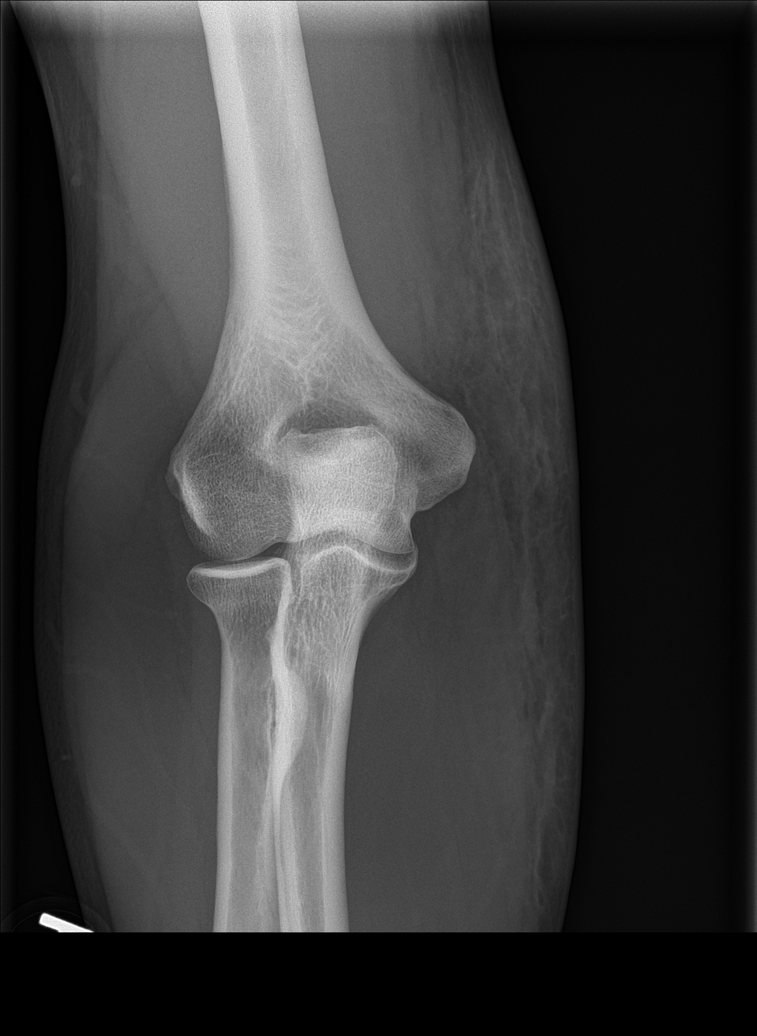

[elbow obl (1 of 2)]
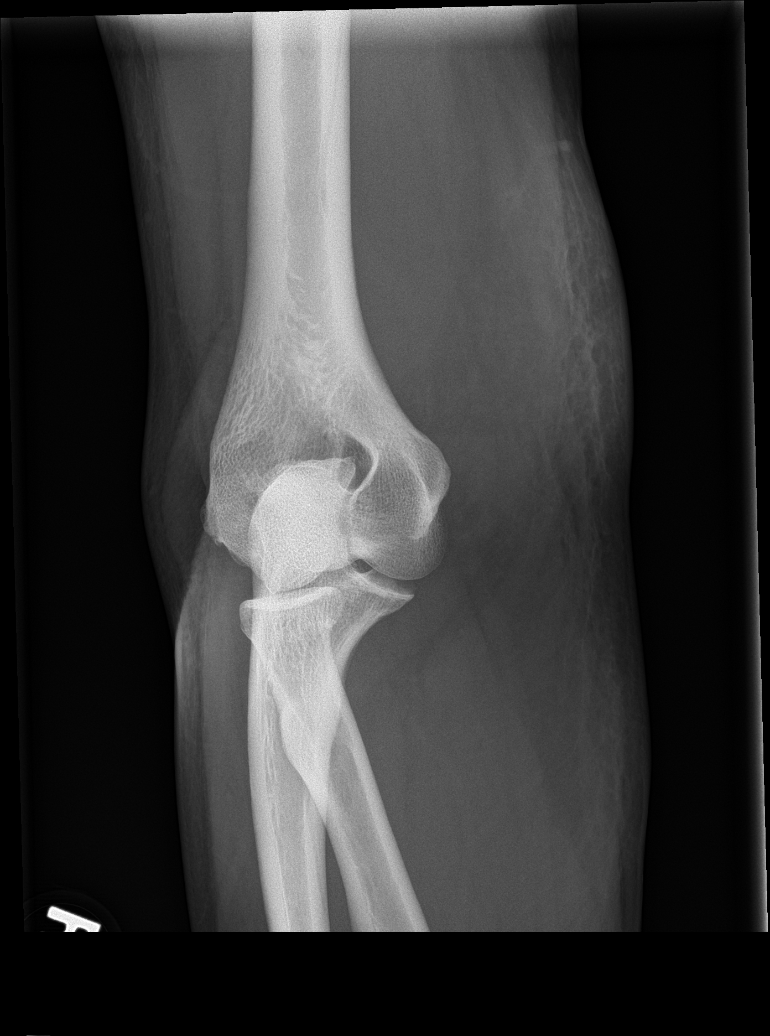

[elbow obl (2 of 2)]
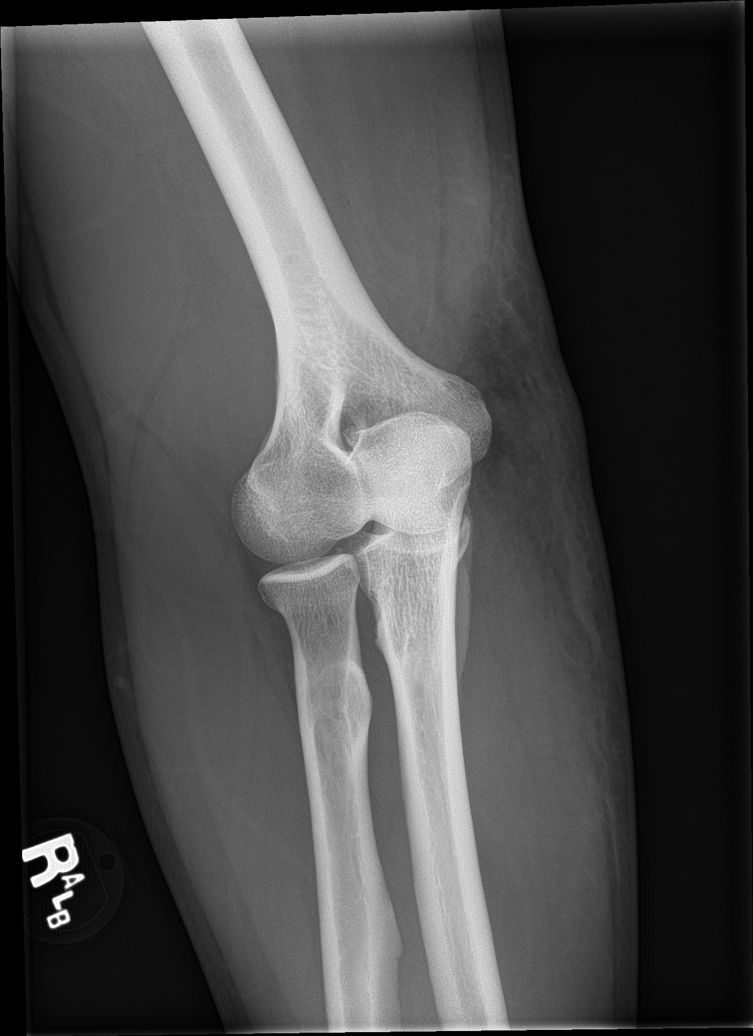

[Series 4: elbow lat · 0.14mm/px · 2 of 2 slices shown]
[im 1/2]
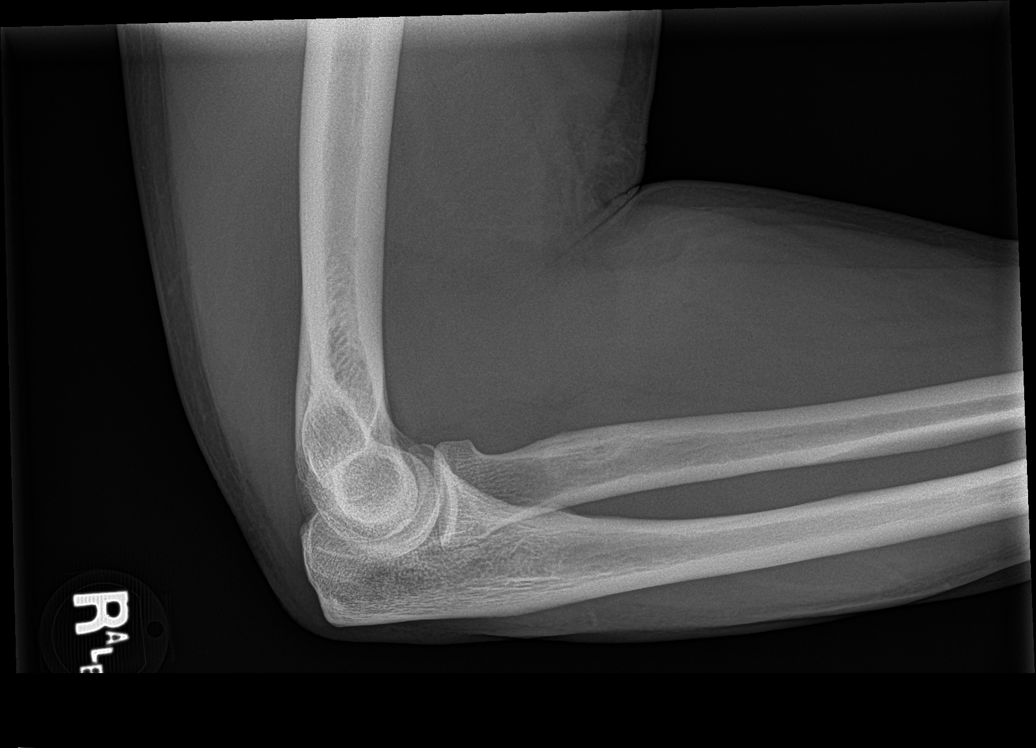
[im 2/2]
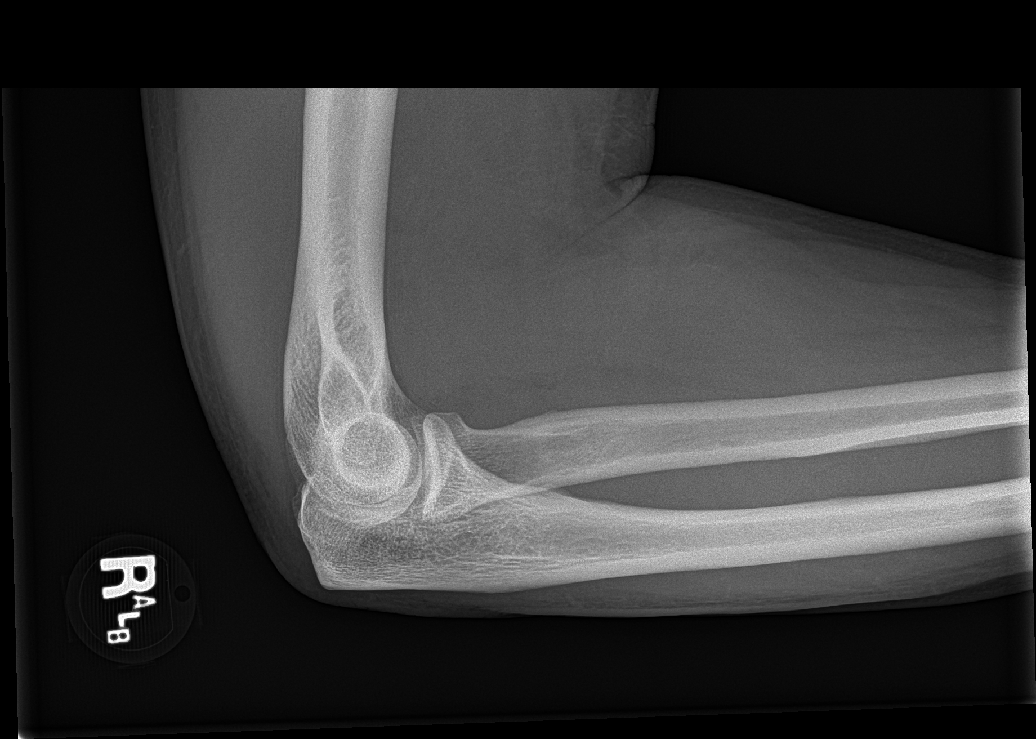

[5 of 5 positions shown; findings below may reference images not displayed]

FINDINGS: There is no acute fracture or dislocation. The bones are well
mineralized. No joint effusion. There is stranding of the
superficial soft tissues of the medial aspect of the elbow.
IMPRESSION: No acute osseous pathology.

## 2020-11-08 LAB — COLOGUARD: COLOGUARD: NEGATIVE

## 2022-08-18 ENCOUNTER — Other Ambulatory Visit (HOSPITAL_COMMUNITY): Payer: Self-pay

## 2024-10-26 LAB — COLOGUARD: COLOGUARD: NEGATIVE

## 2024-11-14 ENCOUNTER — Ambulatory Visit
Admission: EM | Admit: 2024-11-14 | Discharge: 2024-11-14 | Disposition: A | Discharge status: Home / Self Care | Attending: Family Medicine | Admitting: Family Medicine

## 2024-11-14 DIAGNOSIS — J988 Other specified respiratory disorders: Secondary | ICD-10-CM

## 2024-11-14 DIAGNOSIS — B9789 Other viral agents as the cause of diseases classified elsewhere: Secondary | ICD-10-CM | POA: Diagnosis not present

## 2024-11-14 DIAGNOSIS — R062 Wheezing: Secondary | ICD-10-CM

## 2024-11-14 MED ORDER — PREDNISONE 50 MG PO TABS: 50.0000 mg | ORAL_TABLET | Freq: Every day | ORAL | 0 refills | Status: AC

## 2024-11-14 NOTE — ED Triage Notes (Signed)
 Patient states that he started getting sick Friday  Bodyaches Sob Fatigue   Patient states that he feels better but needs a work note

## 2024-11-14 NOTE — ED Provider Notes (Signed)
 " [facility]    CSN: [id number] Arrival date & time: 11/14/24  1019      History   Chief Complaint Chief Complaint  Patient presents with   Shortness of Breath    HPI CORRION STIREWALT is a 54 y.o. male.   HPI  History obtained from the patient. Mathew presents for shortness of breath, body aches, rhinorrhea, dry cough and fatigue. He left work as he felt too weak.  Has been having abdominal cramps. No fever. He is started to feel somewhat better but requests a couple days of rest from his job.  He has to stand 12 hours but is not able to perform his job duties.    He is a cigarette smoker. Has been wheezing.   He has been using his inhaler and its helping.     Past Medical History:  Diagnosis Date   Hypertension    Thyroid disease     There are no active problems to display for this patient.   Past Surgical History:  Procedure Laterality Date   NO PAST SURGERIES         Home Medications    Prior to Admission medications  Medication Sig Start Date End Date Taking? Authorizing Provider  levothyroxine (SYNTHROID, LEVOTHROID) 88 MCG tablet Take 88 mcg by mouth daily before breakfast.   Yes [provider]  lisinopril (PRINIVIL,ZESTRIL) 10 MG tablet Take 10 mg by mouth daily.   Yes [provider]  predniSONE  (DELTASONE ) 50 MG tablet Take 1 tablet (50 mg total) by mouth daily for 5 days. 11/14/24 11/19/24 Yes Jordie Schreur, DO  meloxicam  (MOBIC ) 15 MG tablet Take 1 tablet (15 mg total) by mouth daily. 03/20/16   Desiderio Beagle, MD    Family History History reviewed. No pertinent family history.  Social History Social History[1]   Allergies   Patient has no known allergies.   Review of Systems Review of Systems: negative unless otherwise stated in HPI.      Physical Exam Triage Vital Signs ED Triage Vitals [11/14/24 1227]  Encounter Vitals Group     BP (!) 166/81     Girls Systolic BP Percentile      Girls Diastolic BP  Percentile      Boys Systolic BP Percentile      Boys Diastolic BP Percentile      Pulse Rate 63     Resp 18     Temp 97.7 F (36.5 C)     Temp Source Oral     SpO2 95 %     Weight 240 lb (108.9 kg)     Height      Head Circumference      Peak Flow      Pain Score 0     Pain Loc      Pain Education      Exclude from Growth Chart    No data found.  Updated Vital Signs BP 127/79 (BP Location: Left Arm)   Pulse 63   Temp 97.7 F (36.5 C) (Oral)   Resp 18   Wt 108.9 kg   SpO2 95%   BMI 33.47 kg/m   Visual Acuity Right Eye Distance:   Left Eye Distance:   Bilateral Distance:    Right Eye Near:   Left Eye Near:    Bilateral Near:     Physical Exam GEN:     alert, non-toxic appearing male in no distress    HENT:  mucus membranes moist,  oropharyngeal without lesions or erythema, no tonsillar hypertrophy or exudates EYES:   no scleral injection or discharge  RESP:  no increased work of breathing, expiratory wheezing bilaterally CVS:   regular rate and rhythm ABD:  soft, non-tender  Skin:   warm and dry     UC Treatments / Results  Labs (all labs ordered are listed, but only abnormal results are displayed) Labs Reviewed - No data to display  EKG   Radiology No results found.   Procedures Procedures (including critical care time)  Medications Ordered in UC Medications - No data to display  Initial Impression / Assessment and Plan / UC Course  I have reviewed the triage vital signs and the nursing notes.  Pertinent labs & imaging results that were available during my care of the patient were reviewed by me and considered in my medical decision making (see chart for details).       Pt is a 54 y.o. male who presents for days of GI and respiratory symptoms. Claudio is afebrile here without recent antipyretics. Satting well on room air. Overall pt is non-toxic appearing, well hydrated, without respiratory distress.  Pulmonary exam is remarkable for  bilateral expiratory wheezing and cough.  POC COVID and influenza panel declined.  Chest x-ray declined.  Suspect viral respiratory illness. Discussed symptomatic treatment.  Given prednisone  for wheezing.   Explained lack of efficacy of antibiotics in viral disease.  Typical duration of symptoms discussed.   Return and ED precautions given and voiced understanding. Discussed MDM, treatment plan and plan for follow-up with patient  who agrees with plan.     Final Clinical Impressions(s) / UC Diagnoses   Final diagnoses:  Viral respiratory illness  Wheezing     Discharge Instructions      Stop by the pharmacy to pick up your prescriptions.  Follow up with your primary care provider or return to the urgent care, if not improving.       ED Prescriptions     Medication Sig Dispense Auth. Provider   predniSONE  (DELTASONE ) 50 MG tablet Take 1 tablet (50 mg total) by mouth daily for 5 days. 5 tablet Kriste Berth, DO      PDMP not reviewed this encounter.     [1]  Social History Tobacco Use   Smoking status: Every Day    Current packs/day: 1.00    Types: Cigarettes   Smokeless tobacco: Never  Substance Use Topics   Alcohol use: No   Drug use: No     Kriste Berth, DO 11/18/24 1904  "

## 2024-11-14 NOTE — Discharge Instructions (Signed)
 Stop by the pharmacy to pick up your prescriptions.  Follow up with your primary care provider or return to the urgent care, if not improving.
